# Patient Record
Sex: Male | Born: 1975 | Race: White | Hispanic: No | Marital: Married | Smoking: Never smoker
Health system: Southern US, Community
[De-identification: ages and names within clinical notes are randomized; demographics above are authoritative.]

## PROBLEM LIST (undated history)

## (undated) DIAGNOSIS — I10 Essential (primary) hypertension: Secondary | ICD-10-CM

## (undated) DIAGNOSIS — N200 Calculus of kidney: Secondary | ICD-10-CM

## (undated) HISTORY — PX: LITHOTRIPSY: SUR834

---

## 2006-08-19 HISTORY — PX: RENAL ARTERY STENT: SHX2321

## 2018-01-15 ENCOUNTER — Encounter (HOSPITAL_BASED_OUTPATIENT_CLINIC_OR_DEPARTMENT_OTHER): Payer: Self-pay

## 2018-01-15 ENCOUNTER — Emergency Department (HOSPITAL_BASED_OUTPATIENT_CLINIC_OR_DEPARTMENT_OTHER): Payer: BLUE CROSS/BLUE SHIELD

## 2018-01-15 ENCOUNTER — Other Ambulatory Visit: Payer: Self-pay

## 2018-01-15 ENCOUNTER — Emergency Department (HOSPITAL_BASED_OUTPATIENT_CLINIC_OR_DEPARTMENT_OTHER)
Admission: EM | Admit: 2018-01-15 | Discharge: 2018-01-15 | Disposition: A | Discharge status: Home / Self Care | Payer: BLUE CROSS/BLUE SHIELD | Attending: Emergency Medicine | Admitting: Emergency Medicine

## 2018-01-15 DIAGNOSIS — N201 Calculus of ureter: Secondary | ICD-10-CM

## 2018-01-15 DIAGNOSIS — I10 Essential (primary) hypertension: Secondary | ICD-10-CM | POA: Diagnosis not present

## 2018-01-15 DIAGNOSIS — R1084 Generalized abdominal pain: Secondary | ICD-10-CM | POA: Diagnosis present

## 2018-01-15 HISTORY — DX: Calculus of kidney: N20.0

## 2018-01-15 HISTORY — DX: Essential (primary) hypertension: I10

## 2018-01-15 LAB — URINALYSIS, ROUTINE W REFLEX MICROSCOPIC
Bilirubin Urine: NEGATIVE
GLUCOSE, UA: NEGATIVE mg/dL
KETONES UR: 15 mg/dL — AB
Leukocytes, UA: NEGATIVE
Nitrite: NEGATIVE
PROTEIN: 100 mg/dL — AB
Specific Gravity, Urine: >1.03 — ABNORMAL HIGH (ref 1.005–1.030)
pH: 6 (ref 5.0–8.0)

## 2018-01-15 LAB — URINALYSIS, MICROSCOPIC (REFLEX): RBC / HPF: >50 RBC/hpf (ref 0–5)

## 2018-01-15 MED ORDER — TAMSULOSIN HCL 0.4 MG PO CAPS: 0.4000 mg | ORAL_CAPSULE | Freq: Once | ORAL | Status: DC

## 2018-01-15 MED ORDER — HYDROMORPHONE HCL 1 MG/ML IJ SOLN
INTRAMUSCULAR | Status: AC
  Filled 2018-01-15: qty 1

## 2018-01-15 MED ORDER — OXYCODONE-ACETAMINOPHEN 10-325 MG PO TABS: 1.0000 | ORAL_TABLET | ORAL | 0 refills | Status: AC | PRN

## 2018-01-15 MED ORDER — MORPHINE SULFATE (PF) 4 MG/ML IV SOLN
4.0000 mg | Freq: Once | INTRAVENOUS | Status: AC
Start: 2018-01-15 — End: 2018-01-15
  Administered 2018-01-15: 4 mg via INTRAVENOUS

## 2018-01-15 MED ORDER — ONDANSETRON HCL 4 MG/2ML IJ SOLN
4.0000 mg | Freq: Once | INTRAMUSCULAR | Status: AC
  Administered 2018-01-15: 4 mg via INTRAVENOUS
  Filled 2018-01-15: qty 2

## 2018-01-15 MED ORDER — ONDANSETRON 8 MG PO TBDP: 8.0000 mg | ORAL_TABLET | Freq: Three times a day (TID) | ORAL | 0 refills | Status: AC | PRN

## 2018-01-15 MED ORDER — TAMSULOSIN HCL 0.4 MG PO CAPS: ORAL_CAPSULE | ORAL | 0 refills | Status: AC

## 2018-01-15 MED ORDER — HYDROMORPHONE HCL 1 MG/ML IJ SOLN
1.0000 mg | Freq: Once | INTRAMUSCULAR | Status: AC
  Administered 2018-01-15: 1 mg via INTRAVENOUS
  Filled 2018-01-15: qty 1

## 2018-01-15 MED ORDER — SODIUM CHLORIDE 0.9 % IV BOLUS
1000.0000 mL | Freq: Once | INTRAVENOUS | Status: AC
  Administered 2018-01-15: 1000 mL via INTRAVENOUS

## 2018-01-15 MED ORDER — MORPHINE SULFATE (PF) 4 MG/ML IV SOLN
INTRAVENOUS | Status: AC
  Filled 2018-01-15: qty 1

## 2018-01-15 MED ORDER — MORPHINE SULFATE (PF) 4 MG/ML IV SOLN
4.0000 mg | Freq: Once | INTRAVENOUS | Status: AC
  Administered 2018-01-15: 4 mg via INTRAVENOUS
  Filled 2018-01-15: qty 1

## 2018-01-15 MED ORDER — HYDROMORPHONE HCL 1 MG/ML IJ SOLN
1.0000 mg | Freq: Once | INTRAMUSCULAR | Status: AC
  Administered 2018-01-15: 1 mg via INTRAVENOUS

## 2018-01-15 NOTE — ED Triage Notes (Signed)
Pt c/o bilateral flank pain that started two nights ago with hematuria, has a hx of kidney stones, has not tried anything for his symptoms

## 2018-01-15 NOTE — ED Notes (Signed)
Patient transported to CT 

## 2018-01-15 NOTE — ED Notes (Signed)
Pt placed on RA and will continue to monitor oxygen saturation.

## 2018-01-15 NOTE — ED Provider Notes (Signed)
[facility] Provider Note: Lowella Dell, MD, FACEP  CSN: [id number] MRN: [id number] ARRIVAL: 01/15/18 at 0156 ROOM: [facility]   CHIEF COMPLAINT  Flank Pain   HISTORY OF PRESENT ILLNESS  01/15/18 2:30 AM Luis Daugherty is a 42 y.o. male with a history of kidney stones.  He is here with bilateral flank pain that started 2 nights ago.  His pain began about 11 PM the night before last and lasted about an hour.  It returned yesterday evening about 11 PM and has not let up.  The pain is worse in the left flank and he rates it is severe.  It radiates to his left lower quadrant.  The pain in the right flank is milder.  He has had associated hematuria, nausea and vomiting.  Pain is not worse with movement or palpation.  He has not taken any medications for this.  Pain is consistent with previous kidney stones.   Past Medical History:  Diagnosis Date  . Hypertension   . Kidney stones     Past Surgical History:  Procedure Laterality Date  . LITHOTRIPSY    . RENAL ARTERY STENT Left 2008    No family history on file.  Social History   Tobacco Use  . Smoking status: Never Smoker  . Smokeless tobacco: Former Neurosurgeon    Types: Chew  Substance Use Topics  . Alcohol use: Yes    Comment: occasion  . Drug use: Not on file    Prior to Admission medications   Medication Sig Start Date End Date Taking? Authorizing Provider  ondansetron (ZOFRAN ODT) 8 MG disintegrating tablet Take 1 tablet (8 mg total) by mouth every 8 (eight) hours as needed for nausea or vomiting. 01/15/18   Debroh Sieloff, MD  oxyCODONE-acetaminophen (PERCOCET) 10-325 MG tablet Take 1 tablet by mouth every 4 (four) hours as needed for pain. 01/15/18   Cristian Davitt, MD  tamsulosin (FLOMAX) 0.4 MG CAPS capsule Take 1 capsule daily until stone passes. 01/15/18   Railee Bonillas, MD    Allergies Patient has no known allergies.   REVIEW OF SYSTEMS  Negative except as noted here or in the History of Present  Illness.   PHYSICAL EXAMINATION  Initial Vital Signs Blood pressure (!) 140/108, pulse 91, temperature 98.5 F (36.9 C), temperature source Oral, resp. rate (!) 22, height  (1.753 m), weight 79.4 kg (175 lb), SpO2 100 %.  Examination General: Well-developed, well-nourished male in no acute distress; appearance consistent with age of record HENT: normocephalic; atraumatic Eyes: pupils equal, round and reactive to light; extraocular muscles intact Neck: supple Heart: regular rate and rhythm Lungs: clear to auscultation bilaterally Abdomen: soft; nondistended; nontender; no masses or hepatosplenomegaly; bowel sounds present GU: No CVA tenderness Extremities: No deformity; full range of motion; pulses normal Neurologic: Awake, alert and oriented; motor function intact in all extremities and symmetric; no facial droop Skin: Warm and dry Psychiatric: Grimacing   RESULTS  Summary of this visit's results, reviewed by myself:   EKG Interpretation  Date/Time:    Ventricular Rate:    PR Interval:    QRS Duration:   QT Interval:    QTC Calculation:   R Axis:     Text Interpretation:        Laboratory Studies: Results for orders placed or performed during the hospital encounter of 01/15/18 (from the past 24 hour(s))  Urinalysis, Routine w reflex microscopic     Status: Abnormal   Collection Time: 01/15/18  2:07  AM  Result Value Ref Range   Color, Urine BROWN (A) YELLOW   APPearance CLOUDY (A) CLEAR   Specific Gravity, Urine >1.030 (H) 1.005 - 1.030   pH 6.0 5.0 - 8.0   Glucose, UA NEGATIVE NEGATIVE mg/dL   Hgb urine dipstick LARGE (A) NEGATIVE   Bilirubin Urine NEGATIVE NEGATIVE   Ketones, ur 15 (A) NEGATIVE mg/dL   Protein, ur 811 (A) NEGATIVE mg/dL   Nitrite NEGATIVE NEGATIVE   Leukocytes, UA NEGATIVE NEGATIVE  Urinalysis, Microscopic (reflex)     Status: Abnormal   Collection Time: 01/15/18  2:07 AM  Result Value Ref Range   RBC / HPF >50 0 - 5 RBC/hpf   WBC, UA  0-5 0 - 5 WBC/hpf   Bacteria, UA FEW (A) NONE SEEN   Squamous Epithelial / LPF 0-5 0 - 5   Mucus PRESENT    Imaging Studies: Ct Renal Stone Study  Result Date: 01/15/2018 CLINICAL DATA:  Acute onset of bilateral flank pain and hematuria. EXAM: CT ABDOMEN AND PELVIS WITHOUT CONTRAST TECHNIQUE: Multidetector CT imaging of the abdomen and pelvis was performed following the standard protocol without IV contrast. COMPARISON:  None. FINDINGS: Lower chest: The visualized lung bases are grossly clear. The visualized portions of the mediastinum are unremarkable. Hepatobiliary: The liver is unremarkable in appearance. The gallbladder is unremarkable in appearance. The common bile duct remains normal in caliber. Pancreas: The pancreas is within normal limits. Spleen: The spleen is unremarkable in appearance. Adrenals/Urinary Tract: The adrenal glands are unremarkable in appearance. Minimal left-sided hydronephrosis is noted, with an obstructing 4 x 3 mm stone at the distal left ureter, just above the left vesicoureteral junction. Scattered nonobstructing right renal stones measure up to 3 mm in size. Mild left-sided perinephric stranding is noted. Stomach/Bowel: The stomach is unremarkable in appearance. The small bowel is within normal limits. The appendix is normal in caliber, without evidence of appendicitis. The colon is unremarkable in appearance. Vascular/Lymphatic: The abdominal aorta is unremarkable in appearance. The inferior vena cava is grossly unremarkable. No retroperitoneal lymphadenopathy is seen. No pelvic sidewall lymphadenopathy is identified. Reproductive: The bladder is mildly distended and grossly unremarkable. The prostate remains normal in size. Other: No additional soft tissue abnormalities are seen. Musculoskeletal: No acute osseous abnormalities are identified. A small metallic foreign body is noted embedded at the proximal left quadriceps musculature. The visualized musculature is  unremarkable in appearance. IMPRESSION: 1. Minimal left-sided hydronephrosis, with an obstructing 4 x 3 mm stone at the distal left ureter, just above the left vesicoureteral junction. 2. Scattered nonobstructing right renal stones measure up to 3 mm in size. Electronically Signed   By: Roanna Raider M.D.   On: 01/15/2018 03:02    ED COURSE and MDM  Nursing notes and initial vitals signs, including pulse oximetry, reviewed.  Vitals:   01/15/18 0452 01/15/18 0454 01/15/18 0515 01/15/18 0535  BP:      Pulse: 88 77 89 88  Resp: 12     Temp:      TempSrc:      SpO2: (!) 83% 100% 99% 91%  Weight:      Height:       6:11 AM Pain down to a tolerable level after IV medications.  We will treat with oral medications and refer to urology.  PROCEDURES    ED DIAGNOSES     ICD-10-CM   1. Ureterolithiasis N20.1        Evia Goldsmith, Jonny Ruiz, MD 01/15/18 [phone]

## 2018-01-15 NOTE — ED Notes (Signed)
Pt has a hx of kidney stones and has been working out in the heat for the last two days when he developed his symptoms.

## 2019-06-13 IMAGING — CT CT RENAL STONE PROTOCOL
2 of 4 series · 16 of 46 positions shown, 18 images · non-contrast
Comparison: None.

CLINICAL DATA: Acute onset of bilateral flank pain and hematuria.

EXAM:
CT ABDOMEN AND PELVIS WITHOUT CONTRAST
TECHNIQUE: Multidetector CT imaging of the abdomen and pelvis was performed
following the standard protocol without IV contrast.

[Series 2: axial st · axial · 0.79mm/px · z∈[-481,+19]mm · 13 of 111 slices shown, 15 images]
[im 6/111  soft-tissue]
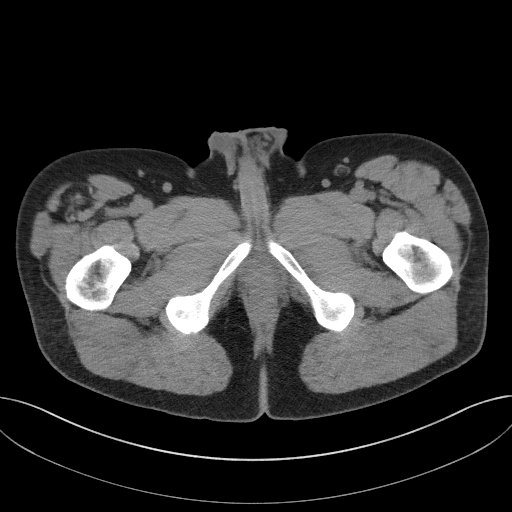
[im 6/111  bone]
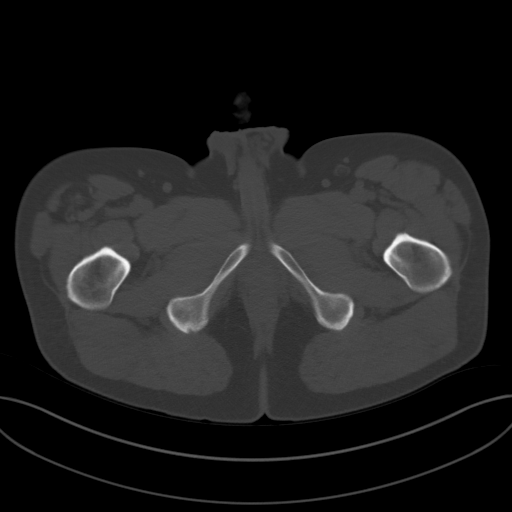
[im 16/111  soft-tissue]
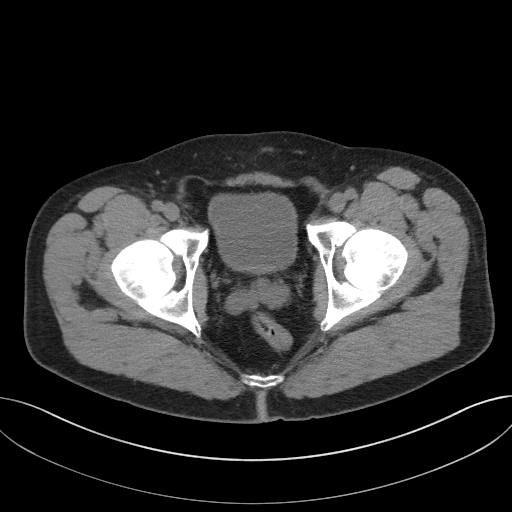
[im 26/111  soft-tissue]
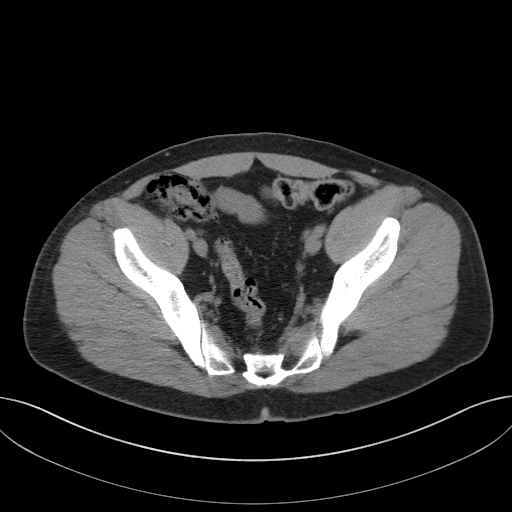
[im 31/111  soft-tissue]
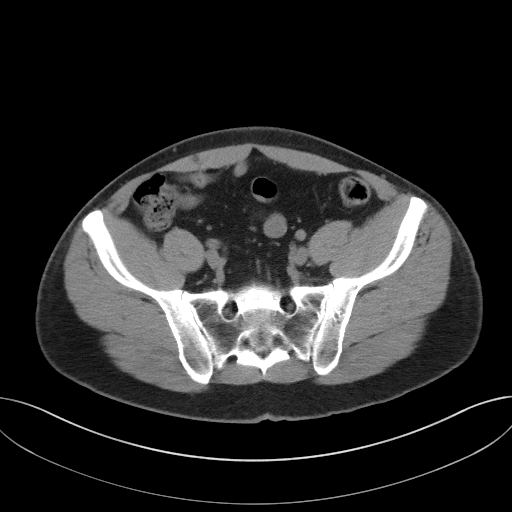
[im 41/111  soft-tissue]
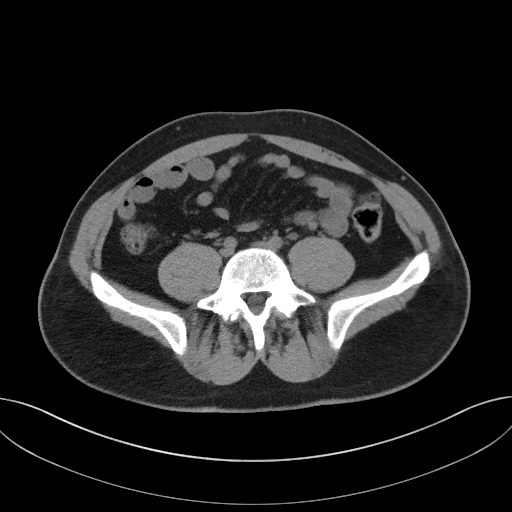
[im 46/111  soft-tissue]
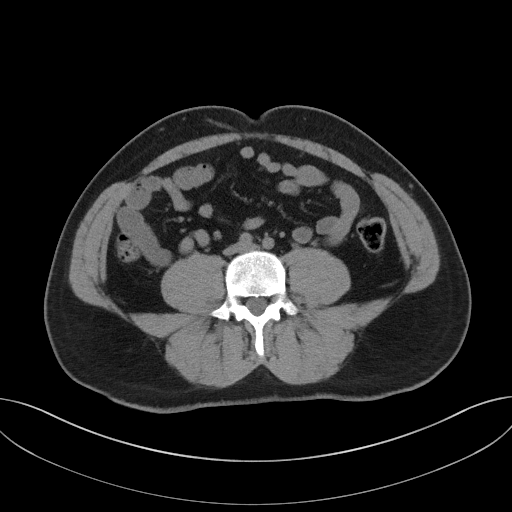
[im 56/111  soft-tissue]
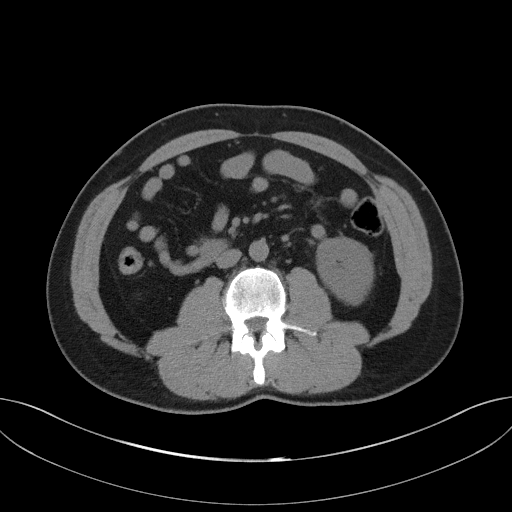
[im 66/111  soft-tissue]
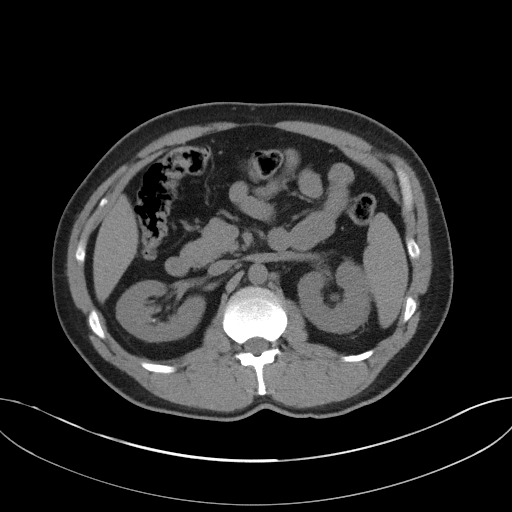
[im 71/111  soft-tissue]
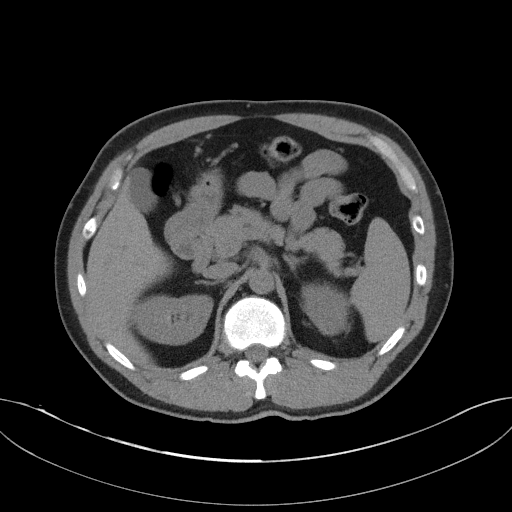
[im 71/111  bone]
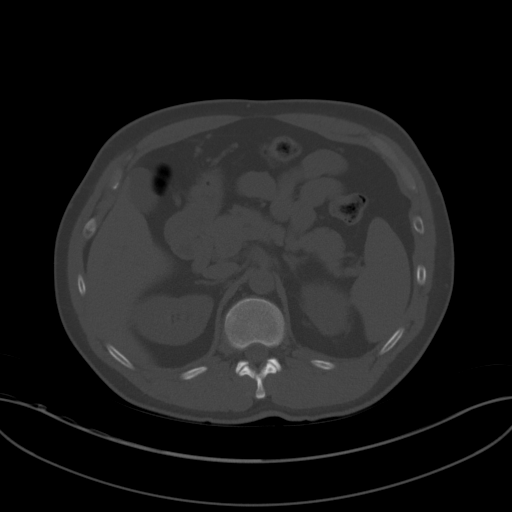
[im 81/111  soft-tissue]
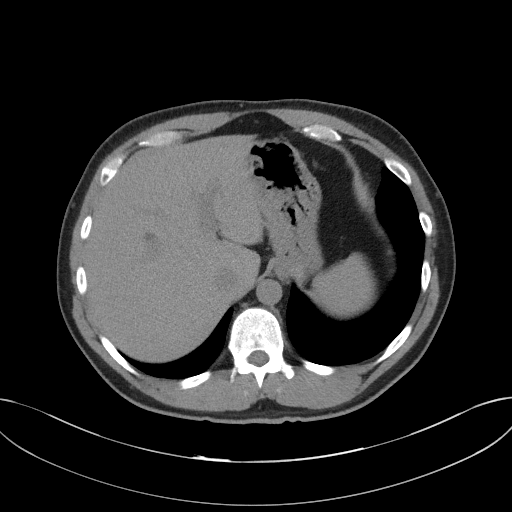
[im 86/111  soft-tissue]
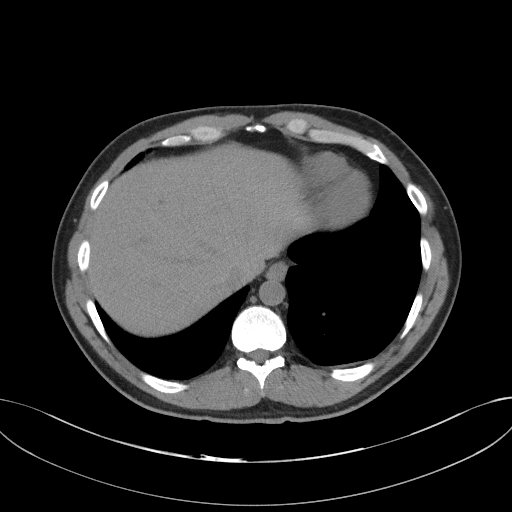
[im 96/111  soft-tissue]
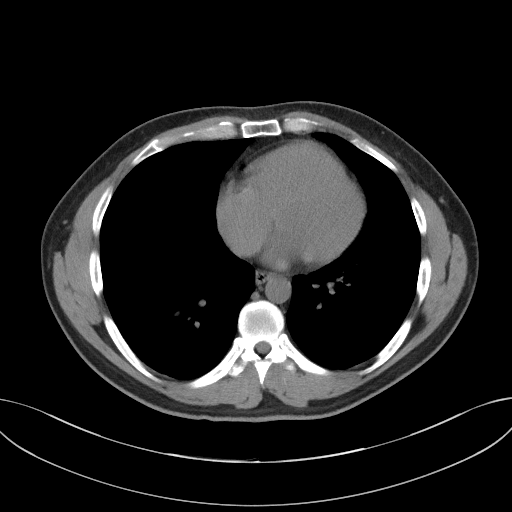
[im 106/111  soft-tissue]
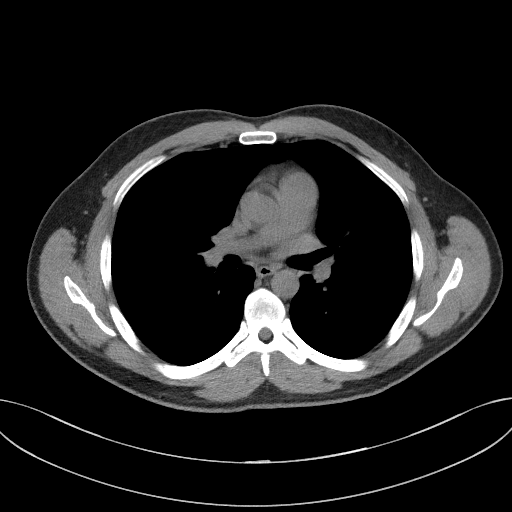

[Series 4: coronal st · coronal · 1.06mm/px · 3 of 91 slices shown]
[im 31/91  soft-tissue]
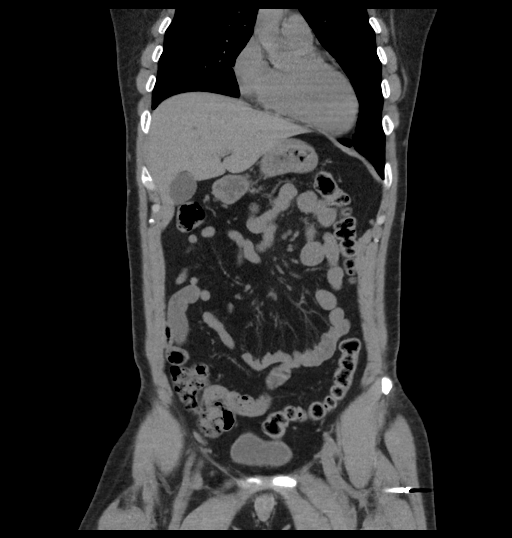
[im 41/91  soft-tissue]
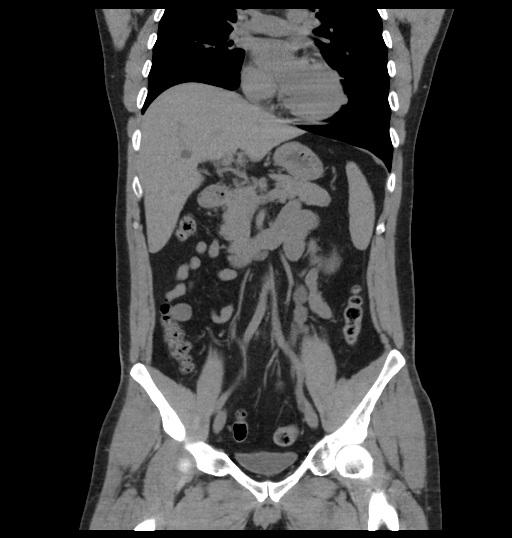
[im 51/91  soft-tissue]
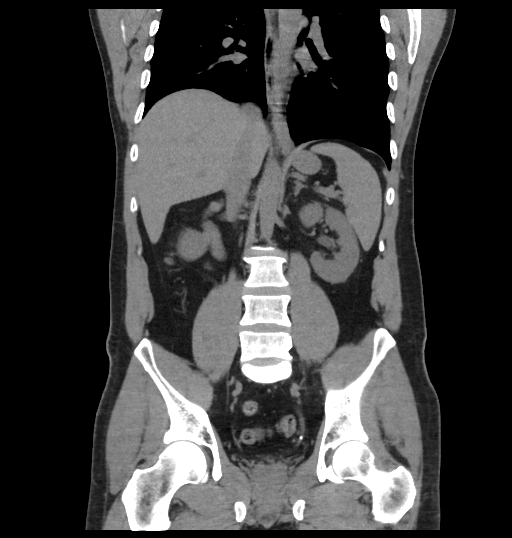

[16 of 46 positions shown; findings below may reference images not displayed]

FINDINGS: Lower chest: The visualized lung bases are grossly clear. The
visualized portions of the mediastinum are unremarkable.

Hepatobiliary: The liver is unremarkable in appearance. The
gallbladder is unremarkable in appearance. The common bile duct
remains normal in caliber.

Pancreas: The pancreas is within normal limits.

Spleen: The spleen is unremarkable in appearance.

Adrenals/Urinary Tract: The adrenal glands are unremarkable in
appearance.

Minimal left-sided hydronephrosis is noted, with an obstructing 4 x
3 mm stone at the distal left ureter, just above the left
vesicoureteral junction. Scattered nonobstructing right renal stones
measure up to 3 mm in size. Mild left-sided perinephric stranding is
noted.

Stomach/Bowel: The stomach is unremarkable in appearance. The small
bowel is within normal limits. The appendix is normal in caliber,
without evidence of appendicitis. The colon is unremarkable in
appearance.

Vascular/Lymphatic: The abdominal aorta is unremarkable in
appearance. The inferior vena cava is grossly unremarkable. No
retroperitoneal lymphadenopathy is seen. No pelvic sidewall
lymphadenopathy is identified.

Reproductive: The bladder is mildly distended and grossly
unremarkable. The prostate remains normal in size.

Other: No additional soft tissue abnormalities are seen.

Musculoskeletal: No acute osseous abnormalities are identified. A
small metallic foreign body is noted embedded at the proximal left
quadriceps musculature. The visualized musculature is unremarkable
in appearance.
IMPRESSION: 1. Minimal left-sided hydronephrosis, with an obstructing 4 x 3 mm
stone at the distal left ureter, just above the left vesicoureteral
junction.
2. Scattered nonobstructing right renal stones measure up to 3 mm in
size.
